# Patient Record
Sex: Male | Born: 2000 | Race: Black or African American | Hispanic: No | Marital: Single | Smoking: Current every day smoker
Health system: Southern US, Community
[De-identification: ages and names within clinical notes are randomized; demographics above are authoritative.]

## PROBLEM LIST (undated history)

## (undated) DIAGNOSIS — S42409A Unspecified fracture of lower end of unspecified humerus, initial encounter for closed fracture: Secondary | ICD-10-CM

## (undated) DIAGNOSIS — R569 Unspecified convulsions: Secondary | ICD-10-CM

---
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
## 2024-03-05 ENCOUNTER — Emergency Department (HOSPITAL_COMMUNITY)
Admission: EM | Admit: 2024-03-05 | Discharge: 2024-03-05 | Disposition: A | Discharge status: Home / Self Care | Attending: Emergency Medicine | Admitting: Emergency Medicine

## 2024-03-05 ENCOUNTER — Other Ambulatory Visit: Payer: Self-pay

## 2024-03-05 ENCOUNTER — Emergency Department (HOSPITAL_COMMUNITY)

## 2024-03-05 ENCOUNTER — Encounter (HOSPITAL_COMMUNITY): Payer: Self-pay

## 2024-03-05 DIAGNOSIS — K6289 Other specified diseases of anus and rectum: Secondary | ICD-10-CM | POA: Diagnosis not present

## 2024-03-05 DIAGNOSIS — R197 Diarrhea, unspecified: Secondary | ICD-10-CM

## 2024-03-05 DIAGNOSIS — R109 Unspecified abdominal pain: Secondary | ICD-10-CM

## 2024-03-05 LAB — RAPID HIV SCREEN (HIV 1/2 AB+AG)
HIV 1/2 Antibodies: NONREACTIVE
HIV-1 P24 Antigen - HIV24: NONREACTIVE

## 2024-03-05 LAB — COMPREHENSIVE METABOLIC PANEL WITH GFR
ALT: 21 U/L (ref 0–44)
AST: 24 U/L (ref 15–41)
Albumin: 3.8 g/dL (ref 3.5–5.0)
Alkaline Phosphatase: 66 U/L (ref 38–126)
Anion gap: 12 (ref 5–15)
BUN: 11 mg/dL (ref 6–20)
CO2: 24 mmol/L (ref 22–32)
Calcium: 9 mg/dL (ref 8.9–10.3)
Chloride: 96 mmol/L — ABNORMAL LOW (ref 98–111)
Creatinine, Ser: 1.16 mg/dL (ref 0.61–1.24)
GFR, Estimated: >60 mL/min (ref >60)
Glucose, Bld: 85 mg/dL (ref 70–99)
Potassium: 3.6 mmol/L (ref 3.5–5.1)
Sodium: 132 mmol/L — ABNORMAL LOW (ref 135–145)
Total Bilirubin: 1.4 mg/dL — ABNORMAL HIGH (ref 0.0–1.2)
Total Protein: 7.2 g/dL (ref 6.5–8.1)

## 2024-03-05 LAB — I-STAT CHEM 8, ED
BUN: 12 mg/dL (ref 6–20)
BUN: 13 mg/dL (ref 6–20)
Calcium, Ion: 1.18 mmol/L (ref 1.15–1.40)
Calcium, Ion: 1.15 mmol/L (ref 1.15–1.40)
Chloride: 97 mmol/L — ABNORMAL LOW (ref 98–111)
Chloride: 96 mmol/L — ABNORMAL LOW (ref 98–111)
Creatinine, Ser: 1.2 mg/dL (ref 0.61–1.24)
Creatinine, Ser: 1.2 mg/dL (ref 0.61–1.24)
Glucose, Bld: 88 mg/dL (ref 70–99)
Glucose, Bld: 82 mg/dL (ref 70–99)
HCT: 47 % (ref 39.0–52.0)
HCT: 45 % (ref 39.0–52.0)
Hemoglobin: 16 g/dL (ref 13.0–17.0)
Hemoglobin: 15.3 g/dL (ref 13.0–17.0)
Potassium: 3.6 mmol/L (ref 3.5–5.1)
Potassium: 3.6 mmol/L (ref 3.5–5.1)
Sodium: 134 mmol/L — ABNORMAL LOW (ref 135–145)
Sodium: 134 mmol/L — ABNORMAL LOW (ref 135–145)
TCO2: 24 mmol/L (ref 22–32)
TCO2: 25 mmol/L (ref 22–32)

## 2024-03-05 LAB — URINALYSIS, W/ REFLEX TO CULTURE (INFECTION SUSPECTED)
Bacteria, UA: NONE SEEN
Bilirubin Urine: NEGATIVE
Glucose, UA: NEGATIVE mg/dL
Hgb urine dipstick: NEGATIVE
Ketones, ur: NEGATIVE mg/dL
Nitrite: NEGATIVE
Protein, ur: 30 mg/dL — AB
Specific Gravity, Urine: 1.018 (ref 1.005–1.030)
pH: 6 (ref 5.0–8.0)

## 2024-03-05 LAB — CBC WITH DIFFERENTIAL/PLATELET
Abs Immature Granulocytes: 0.06 K/uL (ref 0.00–0.07)
Basophils Absolute: 0 K/uL (ref 0.0–0.1)
Basophils Relative: 0 %
Eosinophils Absolute: 0 K/uL (ref 0.0–0.5)
Eosinophils Relative: 0 %
HCT: 44.9 % (ref 39.0–52.0)
Hemoglobin: 15.2 g/dL (ref 13.0–17.0)
Immature Granulocytes: 0 %
Lymphocytes Relative: 7 %
Lymphs Abs: 1.1 K/uL (ref 0.7–4.0)
MCH: 32.1 pg (ref 26.0–34.0)
MCHC: 33.9 g/dL (ref 30.0–36.0)
MCV: 94.7 fL (ref 80.0–100.0)
Monocytes Absolute: 0.9 K/uL (ref 0.1–1.0)
Monocytes Relative: 6 %
Neutro Abs: 12.5 K/uL — ABNORMAL HIGH (ref 1.7–7.7)
Neutrophils Relative %: 87 %
Platelets: 247 K/uL (ref 150–400)
RBC: 4.74 MIL/uL (ref 4.22–5.81)
RDW: 12 % (ref 11.5–15.5)
WBC: 14.6 K/uL — ABNORMAL HIGH (ref 4.0–10.5)
nRBC: 0 % (ref 0.0–0.2)

## 2024-03-05 LAB — I-STAT CG4 LACTIC ACID, ED
Lactic Acid, Venous: 0.7 mmol/L (ref 0.5–1.9)
Lactic Acid, Venous: 0.9 mmol/L (ref 0.5–1.9)

## 2024-03-05 LAB — LIPASE, BLOOD: Lipase: 152 U/L — ABNORMAL HIGH (ref 11–51)

## 2024-03-05 MED ORDER — IOHEXOL 350 MG/ML SOLN
75.0000 mL | Freq: Once | INTRAVENOUS | Status: AC | PRN
  Administered 2024-03-05: 75 mL via INTRAVENOUS

## 2024-03-05 MED ORDER — DOXYCYCLINE HYCLATE 100 MG PO CAPS: 100.0000 mg | ORAL_CAPSULE | Freq: Two times a day (BID) | ORAL | 0 refills | Status: AC

## 2024-03-05 MED ORDER — KETOROLAC TROMETHAMINE 15 MG/ML IJ SOLN
15.0000 mg | Freq: Once | INTRAMUSCULAR | Status: AC
  Administered 2024-03-05: 15 mg via INTRAVENOUS
  Filled 2024-03-05: qty 1

## 2024-03-05 MED ORDER — IOHEXOL 350 MG/ML SOLN: 75.0000 mL | Freq: Once | INTRAVENOUS | Status: DC | PRN

## 2024-03-05 MED ORDER — CEFTRIAXONE SODIUM 1 G IJ SOLR
1.0000 g | Freq: Once | INTRAMUSCULAR | Status: AC
  Administered 2024-03-05: 1 g via INTRAVENOUS
  Filled 2024-03-05: qty 10

## 2024-03-05 MED ORDER — HYDROMORPHONE HCL 1 MG/ML IJ SOLN
0.5000 mg | Freq: Once | INTRAMUSCULAR | Status: AC
  Administered 2024-03-05: 0.5 mg via INTRAVENOUS
  Filled 2024-03-05: qty 1

## 2024-03-05 MED ORDER — ONDANSETRON HCL 4 MG/2ML IJ SOLN
4.0000 mg | Freq: Once | INTRAMUSCULAR | Status: AC
  Administered 2024-03-05: 4 mg via INTRAVENOUS
  Filled 2024-03-05: qty 2

## 2024-03-05 NOTE — ED Provider Notes (Signed)
 [place] EMERGENCY DEPARTMENT AT [facility] Provider Note   CSN: [id number] Arrival date & time: 03/05/24  1159     Patient presents with: Abdominal Pain   Anthony Williams is a 23 y.o. male.   23 year old male history of appendicitis who presents to the emergency department with abdominal pain and diarrhea.  Patient reports that last night he started feeling poorly and having subjective fevers.  Says he started having some lower abdominal pain.  Also had multiple episodes of loose stools.  Says that this morning he is having mucoid stools.  Has had this happen in the past.  Has appendix removed because of this.  Also had a colonoscopy that showed internal hemorrhoids without any other acute abnormalities.  Did have some biopsies without inflammation.  Previously engaged in receptive anal intercourse but says it has been several months since.  Not having any penile discharge or concern for STIs otherwise       Prior to Admission medications   Medication Sig Start Date End Date Taking? Authorizing Provider  doxycycline (VIBRAMYCIN) 100 MG capsule Take 1 capsule (100 mg total) by mouth 2 (two) times daily for 7 days. 03/05/24 03/12/24 Yes Yolande Lamar BROCKS, MD  ibuprofen  (ADVIL ) 200 MG tablet Take 200 mg by mouth every 6 (six) hours as needed.   Yes [provider]    Allergies: Patient has no known allergies.    Review of Systems  Updated Vital Signs BP 114/74 (BP Location: Right Arm)   Pulse 82   Temp 100.1 F (37.8 C) (Oral)   Resp 18   Ht 5' 5 (1.651 m)   Wt 54 kg   SpO2 100%   BMI 19.80 kg/m   Physical Exam Vitals and nursing note reviewed.  Constitutional:      General: He is not in acute distress.    Appearance: He is well-developed.  HENT:     Head: Normocephalic and atraumatic.     Right Ear: External ear normal.     Left Ear: External ear normal.     Nose: Nose normal.  Eyes:     Extraocular Movements: Extraocular movements intact.      Conjunctiva/sclera: Conjunctivae normal.     Pupils: Pupils are equal, round, and reactive to light.  Cardiovascular:     Heart sounds: Normal heart sounds.  Abdominal:     General: There is no distension.     Palpations: Abdomen is soft. There is no mass.     Tenderness: There is abdominal tenderness (Left lower quadrant). There is no guarding.  Musculoskeletal:     Cervical back: Normal range of motion and neck supple.  Neurological:     Mental Status: He is alert.     (all labs ordered are listed, but only abnormal results are displayed) Labs Reviewed  COMPREHENSIVE METABOLIC PANEL WITH GFR - Abnormal; Notable for the following components:      Result Value   Sodium 132 (*)    Chloride 96 (*)    Total Bilirubin 1.4 (*)    All other components within normal limits  CBC WITH DIFFERENTIAL/PLATELET - Abnormal; Notable for the following components:   WBC 14.6 (*)    Neutro Abs 12.5 (*)    All other components within normal limits  URINALYSIS, W/ REFLEX TO CULTURE (INFECTION SUSPECTED) - Abnormal; Notable for the following components:   Protein, ur 30 (*)    Leukocytes,Ua MODERATE (*)    All other components within normal limits  LIPASE, BLOOD - Abnormal; Notable for the following components:   Lipase 152 (*)    All other components within normal limits  I-STAT CHEM 8, ED - Abnormal; Notable for the following components:   Sodium 134 (*)    Chloride 97 (*)    All other components within normal limits  I-STAT CHEM 8, ED - Abnormal; Notable for the following components:   Sodium 134 (*)    Chloride 96 (*)    All other components within normal limits  URINE CULTURE  RAPID HIV SCREEN (HIV 1/2 AB+AG)  RPR  I-STAT CG4 LACTIC ACID, ED  I-STAT CG4 LACTIC ACID, ED    EKG: None  Radiology: CT ABDOMEN PELVIS W CONTRAST Result Date: 03/05/2024 EXAM: CT ABDOMEN AND PELVIS WITH CONTRAST 03/05/2024 02:19:41 PM TECHNIQUE: CT of the abdomen and pelvis was performed with the  administration of 75 mL of iohexol (OMNIPAQUE) 350 MG/ML injection. Multiplanar reformatted images are provided for review. Automated exposure control, iterative reconstruction, and/or weight-based adjustment of the mA/kV was utilized to reduce the radiation dose to as low as reasonably achievable. COMPARISON: None available. CLINICAL HISTORY: LLQ pain. Reports fever abd pain and mucus coming from rectum. Reports last time he had this it was his appendix. Patient reports severe lower abd pain. FINDINGS: LOWER CHEST: No acute abnormality. LIVER: The liver is unremarkable. GALLBLADDER AND BILE DUCTS: Gallbladder is unremarkable. No biliary ductal dilatation. SPLEEN: No acute abnormality. PANCREAS: No acute abnormality. ADRENAL GLANDS: No acute abnormality. KIDNEYS, URETERS AND BLADDER: No stones in the kidneys or ureters. No hydronephrosis. No perinephric or periureteral stranding. The urinary bladder is collapsed. GI AND BOWEL: Stomach demonstrates no acute abnormality. Evaluation of the bowel is limited in the absence of oral contrast. There is no bowel obstruction. Appendectomy. PERITONEUM AND RETROPERITONEUM: No ascites. No free air. VASCULATURE: Aorta is normal in caliber. LYMPH NODES: No lymphadenopathy. REPRODUCTIVE ORGANS: No acute abnormality. BONES AND SOFT TISSUES: No acute osseous abnormality. No focal soft tissue abnormality. IMPRESSION: 1. No acute abnormalities. Electronically signed by: Vanetta Chou MD 03/05/2024 02:35 PM EDT RP Workstation: [id number]     Procedures   Medications Ordered in the ED  iohexol (OMNIPAQUE) 350 MG/ML injection 75 mL ( Intravenous Canceled Entry 03/05/24 1420)  HYDROmorphone (DILAUDID) injection 0.5 mg (0.5 mg Intravenous Given 03/05/24 1250)  ondansetron (ZOFRAN) injection 4 mg (4 mg Intravenous Given 03/05/24 1250)  ketorolac (TORADOL) 15 MG/ML injection 15 mg (15 mg Intravenous Given 03/05/24 1250)  iohexol (OMNIPAQUE) 350 MG/ML injection 75 mL (75 mLs  Intravenous Contrast Given 03/05/24 1410)  cefTRIAXone (ROCEPHIN) 1 g in sodium chloride 0.9 % 100 mL IVPB (0 g Intravenous Stopped 03/05/24 1602)                                    Medical Decision Making Amount and/or Complexity of Data Reviewed Labs: ordered. Radiology: ordered.  Risk Prescription drug management.   23 year old male history of appendicitis and diarrhea in the past who presents emergency department with abdominal pain and diarrhea  Initial Ddx:  Inflammatory bowel disease, colitis, proctitis, C. difficile, infectious diarrhea, diverticulitis  MDM/Course:  Patient presents emergency department with abdominal pain and diarrhea.  On exam is not in acute distress.  Does have some mild left lower quadrant abdominal tenderness palpation.  Has had colonoscopy in the past for similar symptoms and has already had an appendectomy.  Does not appear to have had  any imaging.  Due to concerns for possible diverticulitis and colitis did obtain CT scan today that did not show any acute findings.  Suspect that patient may have proctitis.  With his history of receptive anal intercourse we will go ahead and treat him in case this is related to STIs.  Given a dose of ceftriaxone here in the emergency department and sent home with doxycycline.  Did attempt to get a stool sample but patient was unable to provide it at this point in time.  Will have him follow-up with GI as well to see if he needs additional evaluation for his recurrent diarrhea  This patient presents to the ED for concern of complaints listed in HPI, this involves an extensive number of treatment options, and is a complaint that carries with it a high risk of complications and morbidity. Disposition including potential need for admission considered.   Dispo: DC Home. Return precautions discussed including, but not limited to, those listed in the AVS. Allowed pt time to ask questions which were answered fully prior to  dc.  Additional history obtained from mother Records reviewed Outpatient Clinic Notes The following labs were independently interpreted: Chemistry and show no acute abnormality I independently reviewed the following imaging with scope of interpretation limited to determining acute life threatening conditions related to emergency care: CT Abdomen/Pelvis and agree with the radiologist interpretation with the following exceptions: none I personally reviewed and interpreted cardiac monitoring: normal sinus rhythm  I personally reviewed and interpreted the pt's EKG: see above for interpretation  I have reviewed the patients home medications and made adjustments as needed  Portions of this note were generated with Dragon dictation software. Dictation errors may occur despite best attempts at proofreading.     Final diagnoses:  Proctitis  Diarrhea, unspecified type  Abdominal pain, unspecified abdominal location    ED Discharge Orders          Ordered    doxycycline (VIBRAMYCIN) 100 MG capsule  2 times daily        03/05/24 1454    Ambulatory referral to Gastroenterology        03/05/24 1454               Yolande Lamar BROCKS, MD 03/05/24 [phone]

## 2024-03-05 NOTE — ED Notes (Signed)
 Inefficient amount of stool provided, unable to send sample at this time.

## 2024-03-05 NOTE — ED Triage Notes (Signed)
 Reports fever abd pain and mucus coming from rectum.  Reports last time he had this it was his appendix.  Patient reports severe lower abd pain.

## 2024-03-05 NOTE — Discharge Instructions (Addendum)
 You were seen for proctitis in the emergency department.   At home, please take the antibiotics we have given you.    Check your MyChart online for the results of any tests that had not resulted by the time you left the emergency department.   Follow-up with your primary doctor in 2-3 days regarding your visit.  Follow-up with GI about your symptoms.   Return immediately to the emergency department if you experience any of the following: worsening pain, or any other concerning symptoms.    Thank you for visiting our Emergency Department. It was a pleasure taking care of you today.

## 2024-03-06 LAB — URINE CULTURE: Culture: NO GROWTH

## 2024-03-06 LAB — RPR: RPR Ser Ql: NONREACTIVE

## 2024-06-09 ENCOUNTER — Ambulatory Visit: Admitting: Gastroenterology

## 2024-06-09 NOTE — Progress Notes (Unsigned)
 "  Chief Complaint:follow-up after ED visit abd pain, diarrhea Primary GI Doctor:***  HPI:  Patient is a  24  year old male patient with past medical history of idiopathic epilepsy,*****who was referred to me by Yolande Lamar BROCKS, MD on 03/05/24 for a evaluation of abdominal pain, diarrhea .    03/05/24 ED visit for diarrhea and abdominal pain. CT scan unremarkable. Labs show: Sodium 132, total bili 1.4, WBC 14.6, Hgb 15.2, lipase 152, HIV neg. Suspect that patient Ceilidh Torregrossa have proctitis. With his history of receptive anal intercourse we will go ahead and treat him in case this is related to STIs. Given a dose of ceftriaxone  here in the emergency department and sent home with doxycycline .   Interval History  Patient admits/denies GERD Patient admits/denies dysphagia Patient admits/denies nausea, vomiting, or weight loss  Patient admits/denies altered bowel habits Patient admits/denies abdominal pain Patient admits/denies rectal bleeding   Denies/Admits alcohol Denies/Admits smoking Denies/Admits NSAID use. Denies/Admits they are on blood thinners.  Patients last colonoscopy Patients last EGD  Surgical history:  Patient's family history includes  Wt Readings from Last 3 Encounters:  03/05/24 119 lb (54 kg)  07/01/20 119 lb (54 kg) (6%, Z= -1.54)*  01/01/13 72 lb (32.7 kg) (40%, Z= -0.25)*   * Growth percentiles are based on CDC (Boys, 2-20 Years) data.      Past Medical History:  Diagnosis Date   Elbow fracture    right   Seizures (HCC)     No past surgical history on file.  Current Outpatient Medications  Medication Sig Dispense Refill   ibuprofen  (ADVIL ) 200 MG tablet Take 200 mg by mouth every 6 (six) hours as needed.     No current facility-administered medications for this visit.    Allergies as of 06/09/2024   (No Known Allergies)    Family History  Problem Relation Age of Onset   Healthy Mother    Healthy Father     Review of Systems:     Constitutional: No weight loss, fever, chills, weakness or fatigue HEENT: Eyes: No change in vision               Ears, Nose, Throat:  No change in hearing or congestion Skin: No rash or itching Cardiovascular: No chest pain, chest pressure or palpitations   Respiratory: No SOB or cough Gastrointestinal: See HPI and otherwise negative Genitourinary: No dysuria or change in urinary frequency Neurological: No headache, dizziness or syncope Musculoskeletal: No new muscle or joint pain Hematologic: No bleeding or bruising Psychiatric: No history of depression or anxiety    Physical Exam:  Vital signs: There were no vitals taken for this visit.  Constitutional:   Pleasant *** male/male appears to be in NAD, Well developed, Well nourished, alert and cooperative Eyes:   PEERL, EOMI. No icterus. Conjunctiva pink. Neck:  Supple Throat: Oral cavity and pharynx without inflammation, swelling or lesion.  Respiratory: Respirations even and unlabored. Lungs clear to auscultation bilaterally.   No wheezes, crackles, or rhonchi.  Cardiovascular: Normal S1, S2. Regular rate and rhythm. No peripheral edema, cyanosis or pallor.  Gastrointestinal:  Soft, nondistended, nontender. No rebound or guarding. Normal bowel sounds. No appreciable masses or hepatomegaly. Rectal:  Not performed.  Anoscopy: Msk:  Symmetrical without gross deformities. Without edema, no deformity or joint abnormality.  Neurologic:  Alert and  oriented x4;  grossly normal neurologically.  Skin:   Dry and intact without significant lesions or rashes.  RELEVANT LABS AND IMAGING: CBC  Latest Ref Rng & Units 03/05/2024    1:09 PM 03/05/2024   12:25 PM 03/05/2024   12:19 PM  CBC  WBC 4.0 - 10.5 K/uL   14.6   Hemoglobin 13.0 - 17.0 g/dL 84.6  83.9  84.7   Hematocrit 39.0 - 52.0 % 45.0  47.0  44.9   Platelets 150 - 400 K/uL   247      CMP     Latest Ref Rng & Units 03/05/2024    1:09 PM 03/05/2024   12:25 PM 03/05/2024    12:19 PM  CMP  Glucose 70 - 99 mg/dL 82  88  85   BUN 6 - 20 mg/dL 13  12  11    Creatinine 0.61 - 1.24 mg/dL 8.79  8.79  8.83   Sodium 135 - 145 mmol/L 134  134  132   Potassium 3.5 - 5.1 mmol/L 3.6  3.6  3.6   Chloride 98 - 111 mmol/L 96  97  96   CO2 22 - 32 mmol/L   24   Calcium 8.9 - 10.3 mg/dL   9.0   Total Protein 6.5 - 8.1 g/dL   7.2   Total Bilirubin 0.0 - 1.2 mg/dL   1.4   Alkaline Phos 38 - 126 U/L   66   AST 15 - 41 U/L   24   ALT 0 - 44 U/L   21    03/05/24 CTAP IMPRESSION: 1. No acute abnormalities.  09/2021 colonoscopy in [place], [place] Normal colonoscopy Final Diagnosis A.  TI, endoscopic biopsy:    - Benign small bowel mucosa    - Normal villous architecture    - Active inflammation and granulomas not identified B.  Random colon, endoscopic biopsy:    - Benign colonic mucosa    - No evidence of microscopic colitis    - No evidence of dysplasia      Assessment: 1. ***  Plan: -CBC, BMET, hepatic panel, CRP, TTG IgA, IgA, TSH  -stool tests   Thank you for the courtesy of this consult. Please call me with any questions or concerns.   Alizabeth Antonio, FNP-C [facility] 06/09/2024, 6:26 AM  Cc: Yolande Lamar BROCKS, MD  "

## 2024-07-05 ENCOUNTER — Inpatient Hospital Stay (HOSPITAL_COMMUNITY)
Admission: AD | Admit: 2024-07-05 | Discharge: 2024-07-06 | Disposition: A | Source: Intra-hospital | Attending: Student in an Organized Health Care Education/Training Program

## 2024-07-05 ENCOUNTER — Other Ambulatory Visit: Payer: Self-pay

## 2024-07-05 ENCOUNTER — Ambulatory Visit (HOSPITAL_COMMUNITY)
Admission: EM | Admit: 2024-07-05 | Discharge: 2024-07-05 | Disposition: A | Discharge status: Other Acute Inpatient Hospital | Attending: Student in an Organized Health Care Education/Training Program | Admitting: Student in an Organized Health Care Education/Training Program

## 2024-07-05 ENCOUNTER — Encounter (HOSPITAL_COMMUNITY): Payer: Self-pay

## 2024-07-05 DIAGNOSIS — Z9151 Personal history of suicidal behavior: Secondary | ICD-10-CM | POA: Insufficient documentation

## 2024-07-05 DIAGNOSIS — Z5689 Other problems related to employment: Secondary | ICD-10-CM | POA: Insufficient documentation

## 2024-07-05 DIAGNOSIS — Z5900 Homelessness unspecified: Secondary | ICD-10-CM | POA: Insufficient documentation

## 2024-07-05 DIAGNOSIS — F1290 Cannabis use, unspecified, uncomplicated: Secondary | ICD-10-CM | POA: Insufficient documentation

## 2024-07-05 DIAGNOSIS — F332 Major depressive disorder, recurrent severe without psychotic features: Principal | ICD-10-CM | POA: Diagnosis present

## 2024-07-05 DIAGNOSIS — F411 Generalized anxiety disorder: Secondary | ICD-10-CM | POA: Insufficient documentation

## 2024-07-05 DIAGNOSIS — F419 Anxiety disorder, unspecified: Secondary | ICD-10-CM | POA: Insufficient documentation

## 2024-07-05 DIAGNOSIS — Z5982 Transportation insecurity: Secondary | ICD-10-CM | POA: Insufficient documentation

## 2024-07-05 DIAGNOSIS — F4323 Adjustment disorder with mixed anxiety and depressed mood: Secondary | ICD-10-CM | POA: Insufficient documentation

## 2024-07-05 DIAGNOSIS — G4700 Insomnia, unspecified: Secondary | ICD-10-CM | POA: Insufficient documentation

## 2024-07-05 LAB — ETHANOL: Alcohol, Ethyl (B): <15 mg/dL

## 2024-07-05 LAB — CBC WITH DIFFERENTIAL/PLATELET
Abs Immature Granulocytes: 0.02 10*3/uL (ref 0.00–0.07)
Basophils Absolute: 0 10*3/uL (ref 0.0–0.1)
Basophils Relative: 1 %
Eosinophils Absolute: 0.2 10*3/uL (ref 0.0–0.5)
Eosinophils Relative: 3 %
HCT: 45.3 % (ref 39.0–52.0)
Hemoglobin: 15.2 g/dL (ref 13.0–17.0)
Immature Granulocytes: 0 %
Lymphocytes Relative: 30 %
Lymphs Abs: 1.9 10*3/uL (ref 0.7–4.0)
MCH: 31.7 pg (ref 26.0–34.0)
MCHC: 33.6 g/dL (ref 30.0–36.0)
MCV: 94.4 fL (ref 80.0–100.0)
Monocytes Absolute: 0.6 10*3/uL (ref 0.1–1.0)
Monocytes Relative: 9 %
Neutro Abs: 3.8 10*3/uL (ref 1.7–7.7)
Neutrophils Relative %: 57 %
Platelets: 269 10*3/uL (ref 150–400)
RBC: 4.8 MIL/uL (ref 4.22–5.81)
RDW: 12.1 % (ref 11.5–15.5)
WBC: 6.5 10*3/uL (ref 4.0–10.5)
nRBC: 0 % (ref 0.0–0.2)

## 2024-07-05 LAB — TSH: TSH: 0.712 u[IU]/mL (ref 0.350–4.500)

## 2024-07-05 LAB — POCT URINE DRUG SCREEN - MANUAL ENTRY (I-SCREEN)
POC Amphetamine UR: NOT DETECTED
POC Buprenorphine (BUP): NOT DETECTED
POC Cocaine UR: NOT DETECTED
POC Marijuana UR: POSITIVE — AB
POC Methadone UR: NOT DETECTED
POC Methamphetamine UR: NOT DETECTED
POC Morphine: NOT DETECTED
POC Oxazepam (BZO): NOT DETECTED
POC Oxycodone UR: NOT DETECTED
POC Secobarbital (BAR): NOT DETECTED

## 2024-07-05 LAB — COMPREHENSIVE METABOLIC PANEL WITH GFR
ALT: 45 U/L — ABNORMAL HIGH (ref 0–44)
AST: 36 U/L (ref 15–41)
Albumin: 4.7 g/dL (ref 3.5–5.0)
Alkaline Phosphatase: 72 U/L (ref 38–126)
Anion gap: 10 (ref 5–15)
BUN: 15 mg/dL (ref 6–20)
CO2: 28 mmol/L (ref 22–32)
Calcium: 9.6 mg/dL (ref 8.9–10.3)
Chloride: 98 mmol/L (ref 98–111)
Creatinine, Ser: 1.02 mg/dL (ref 0.61–1.24)
GFR, Estimated: >60 mL/min
Glucose, Bld: 66 mg/dL — ABNORMAL LOW (ref 70–99)
Potassium: 4.6 mmol/L (ref 3.5–5.1)
Sodium: 135 mmol/L (ref 135–145)
Total Bilirubin: 0.8 mg/dL (ref 0.0–1.2)
Total Protein: 7.5 g/dL (ref 6.5–8.1)

## 2024-07-05 LAB — LIPID PANEL
Cholesterol: 164 mg/dL (ref 0–200)
HDL: 60 mg/dL
LDL Cholesterol: 95 mg/dL (ref 0–99)
Total CHOL/HDL Ratio: 2.7 ratio
Triglycerides: 46 mg/dL
VLDL: 9 mg/dL (ref 0–40)

## 2024-07-05 LAB — HEMOGLOBIN A1C
Hgb A1c MFr Bld: 5.4 % (ref 4.8–5.6)
Mean Plasma Glucose: 108.28 mg/dL

## 2024-07-05 LAB — VITAMIN D 25 HYDROXY (VIT D DEFICIENCY, FRACTURES): Vit D, 25-Hydroxy: 10.8 ng/mL — ABNORMAL LOW (ref 30–100)

## 2024-07-05 MED ORDER — LORAZEPAM 2 MG/ML IJ SOLN: 2.0000 mg | Freq: Three times a day (TID) | INTRAMUSCULAR | Status: DC | PRN

## 2024-07-05 MED ORDER — NICOTINE 14 MG/24HR TD PT24: 14.0000 mg | MEDICATED_PATCH | Freq: Every day | TRANSDERMAL | Status: DC

## 2024-07-05 MED ORDER — DIPHENHYDRAMINE HCL 50 MG/ML IJ SOLN: 50.0000 mg | Freq: Three times a day (TID) | INTRAMUSCULAR | Status: DC | PRN

## 2024-07-05 MED ORDER — DIPHENHYDRAMINE HCL 25 MG PO CAPS: 50.0000 mg | ORAL_CAPSULE | Freq: Three times a day (TID) | ORAL | Status: DC | PRN

## 2024-07-05 MED ORDER — DIPHENHYDRAMINE HCL 50 MG PO CAPS: 50.0000 mg | ORAL_CAPSULE | Freq: Three times a day (TID) | ORAL | Status: DC | PRN

## 2024-07-05 MED ORDER — HYDROXYZINE HCL 25 MG PO TABS
25.0000 mg | ORAL_TABLET | Freq: Three times a day (TID) | ORAL | Status: DC | PRN
  Administered 2024-07-05: 25 mg via ORAL
  Filled 2024-07-05: qty 1

## 2024-07-05 MED ORDER — NICOTINE 14 MG/24HR TD PT24
14.0000 mg | MEDICATED_PATCH | Freq: Every day | TRANSDERMAL | Status: DC
  Administered 2024-07-05: 14 mg via TRANSDERMAL
  Filled 2024-07-05: qty 1

## 2024-07-05 MED ORDER — HYDROXYZINE HCL 25 MG PO TABS
25.0000 mg | ORAL_TABLET | Freq: Three times a day (TID) | ORAL | Status: DC | PRN
  Administered 2024-07-05 – 2024-07-06 (×2): 25 mg via ORAL
  Filled 2024-07-05 (×2): qty 1

## 2024-07-05 MED ORDER — HALOPERIDOL LACTATE 5 MG/ML IJ SOLN: 10.0000 mg | Freq: Three times a day (TID) | INTRAMUSCULAR | Status: DC | PRN

## 2024-07-05 MED ORDER — ACETAMINOPHEN 325 MG PO TABS: 650.0000 mg | ORAL_TABLET | Freq: Four times a day (QID) | ORAL | Status: DC | PRN

## 2024-07-05 MED ORDER — ALUM & MAG HYDROXIDE-SIMETH 200-200-20 MG/5ML PO SUSP: 30.0000 mL | ORAL | Status: DC | PRN

## 2024-07-05 MED ORDER — HALOPERIDOL LACTATE 5 MG/ML IJ SOLN: 5.0000 mg | Freq: Three times a day (TID) | INTRAMUSCULAR | Status: DC | PRN

## 2024-07-05 MED ORDER — TRAZODONE HCL 50 MG PO TABS: 50.0000 mg | ORAL_TABLET | Freq: Every evening | ORAL | Status: DC | PRN

## 2024-07-05 MED ORDER — TRAZODONE HCL 50 MG PO TABS
50.0000 mg | ORAL_TABLET | Freq: Every evening | ORAL | Status: DC | PRN
  Administered 2024-07-05: 50 mg via ORAL
  Filled 2024-07-05: qty 1

## 2024-07-05 MED ORDER — HALOPERIDOL 5 MG PO TABS: 5.0000 mg | ORAL_TABLET | Freq: Three times a day (TID) | ORAL | Status: DC | PRN

## 2024-07-05 MED ORDER — MAGNESIUM HYDROXIDE 400 MG/5ML PO SUSP: 30.0000 mL | Freq: Every day | ORAL | Status: DC | PRN
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
## 2024-07-05 NOTE — ED Provider Notes (Signed)
 [facility] Continuous Assessment Admission H&P  Date: 07/05/24 Patient Name: Anthony Williams MRN: [id number] Chief Complaint:  I have been having an hard time these couple of months  Diagnoses:  Final diagnoses:  Severe episode of recurrent major depressive disorder, without psychotic features (HCC)  Marijuana use   History of Present Illness (HPI): Anthony Williams is a 24 year old male with a history of major depressive disorder who presented voluntarily as a walk-in to [facility] on 07/05/2024. He presented with complaints of worsening depressive symptoms and passive suicidal ideation. The patient was seen face-to-face by this provider, and the chart was reviewed.  The patient reports that over the past several months he has experienced significant psychosocial stressors contributing to a progressive decline in mood. He states that he works as a primary school teacher at [employer] and reports that since October 2025 his work hours have been significantly reduced, resulting in financial instability. He reports being unable to pay his bills, which led to the loss of his housing in December 2025. Since that time, he has been intermittently staying with friends and reports ongoing housing insecurity. He endorses difficulty securing new employment, which has further contributed to feelings of hopelessness and helplessness. The patient reports worsening depressive symptoms, including difficulty getting out of bed, persistent low mood, anhedonia, decreased energy, poor concentration, decreased appetite, and insomnia, reporting approximately three hours of sleep per night. He endorses feelings of hopelessness, stating, I just dont see a way out, and I dont have anything left to give. He reports passive suicidal ideation, stating, I know that if I wake up tomorrow it will be the same thing, and I just cant do it, but denies active suicidal intent or  plan at this time. The patient reports a history of non-suicidal self-injurious behavior and a prior suicide attempt approximately five years ago during the COVID-19 pandemic, when he attempted overdose by ingesting multiple pills available in his home. He denies being hospitalized after this incident but states that he was disappointed when he woke. He reports daily nicotine  vape use and daily marijuana use. He endorses occasional alcohol use, reporting approximately 2-3 shots on weekends. He is not currently engaged in outpatient therapy or medication management and reports his last engagement in therapy was around age 76.  The patient denies access to firearms or other weapons in the home where he is currently staying.   During evaluation, Anthony Williams was seated and appeared in no acute distress. He was alert and oriented 4. Behavior was cooperative but withdrawn, with tearfulness noted throughout the assessment. Mood was depressed with blunted affect. Speech was clear but of decreased volume and with normal rate. Eye contact was intermittent. Thought process was coherent, logical, and goal directed. There was no objective evidence of the patient responding to internal or external stimuli or experiencing delusional thought content. The patient endorsed passive suicidal ideation without active intent or plan and denied current homicidal ideation, auditory hallucinations, visual hallucinations, and paranoia. Insight and judgment were fair. The patient was able to answer questions appropriately despite emotional distress.  Medical Decision Making: Given the combination of passive suicidal ideation, prior suicide attempt, poor sleep, limited coping capacity, psychosocial instability, and lack of outpatient supports, the patient is assessed to be at moderate to high risk for self-harm. He is unable to ensure his own safety at this time. Inpatient psychiatric admission is medically necessary for safety  monitoring, initiation and stabilization of pharmacologic  treatment, structured therapeutic intervention, and comprehensive discharge planning to address housing and psychosocial needs.    Total Time spent with patient: 45 minutes  Musculoskeletal  Strength & Muscle Tone: within normal limits Gait & Station: normal Patient leans: N/A  Psychiatric Specialty Exam  Presentation General Appearance:  Casual  Eye Contact: Minimal  Speech: Normal Rate  Speech Volume: Decreased  Handedness: Right   Mood and Affect  Mood: Depressed; Hopeless  Affect: Blunt; Tearful   Thought Process  Thought Processes: Linear; Coherent  Descriptions of Associations:Intact  Orientation:None  Thought Content:No data recorded   Hallucinations:No data recorded Ideas of Reference:No data recorded Suicidal Thoughts:Suicidal Thoughts: Yes, Passive SI Passive Intent and/or Plan: Without Intent; Without Plan  Homicidal Thoughts:Homicidal Thoughts: No   Sensorium  Memory: Immediate Fair; Recent Fair; Remote Fair  Judgment: Fair  Insight: Present   Executive Functions  Concentration: Fair  Attention Span: Fair  Recall: Fair  Fund of Knowledge: Good  Language: Good   Psychomotor Activity  Psychomotor Activity: Psychomotor Activity: Psychomotor Retardation   Assets  Assets: Communication Skills; Desire for Improvement; Resilience; Physical Health   Sleep  Sleep: Sleep: Poor Number of Hours of Sleep: 3   Nutritional Assessment (For OBS and FBC admissions only) Has the patient had a weight loss or gain of 10 pounds or more in the last 3 months?: No Has the patient had a decrease in food intake/or appetite?: Yes Does the patient have dental problems?: No Does the patient have eating habits or behaviors that may be indicators of an eating disorder including binging or inducing vomiting?: No Has the patient recently lost weight without trying?: 0 Has the  patient been eating poorly because of a decreased appetite?: 1 Malnutrition Screening Tool Score: 1    Physical Exam Vitals and nursing note reviewed.  Cardiovascular:     Rate and Rhythm: Normal rate.  Pulmonary:     Effort: Pulmonary effort is normal.  Neurological:     Mental Status: He is alert.  Psychiatric:        Attention and Perception: He does not perceive auditory or visual hallucinations.        Mood and Affect: Mood is depressed. Affect is blunt.        Speech: Speech normal.        Behavior: Behavior is cooperative.        Thought Content: Thought content does not include homicidal ideation. Thought content does not include homicidal plan.    Review of Systems  Psychiatric/Behavioral:  Positive for depression and substance abuse. Negative for hallucinations. The patient has insomnia.     Blood pressure 116/76, pulse 65, temperature 98.4 F (36.9 C), resp. rate 18, SpO2 100%. There is no height or weight on file to calculate BMI.  Past Psychiatric History: MDD  Is the patient at risk to self? yes Has the patient been a risk to self in the past 6 months? No .    Has the patient been a risk to self within the distant past? Yes   Is the patient a risk to others? No   Has the patient been a risk to others in the past 6 months? No   Has the patient been a risk to others within the distant past? No   Past Medical History: None reported  Family History: Mother with MDD & GAD  Social History: Patient is a 24 year old male with a high school diploma. He is currently employed as a primary school teacher at  [employer] with reduced work hours, resulting in financial strain. He is experiencing housing insecurity and is intermittently staying with friends. Patient reports a support system consisting of his mother, boyfriend, and best friend. Substance use history includes daily nicotine  vaping, daily marijuana use, and occasional alcohol use (2-3 shots on weekends).  Patient is not currently engaged in outpatient mental health treatment.  Last Labs:  Admission on 07/05/2024  Component Date Value Ref Range Status   WBC 07/05/2024 6.5  4.0 - 10.5 K/uL Final   RBC 07/05/2024 4.80  4.22 - 5.81 MIL/uL Final   Hemoglobin 07/05/2024 15.2  13.0 - 17.0 g/dL Final   HCT 97/95/7973 45.3  39.0 - 52.0 % Final   MCV 07/05/2024 94.4  80.0 - 100.0 fL Final   MCH 07/05/2024 31.7  26.0 - 34.0 pg Final   MCHC 07/05/2024 33.6  30.0 - 36.0 g/dL Final   RDW 97/95/7973 12.1  11.5 - 15.5 % Final   Platelets 07/05/2024 269  150 - 400 K/uL Final   nRBC 07/05/2024 0.0  0.0 - 0.2 % Final   Neutrophils Relative % 07/05/2024 57  % Final   Neutro Abs 07/05/2024 3.8  1.7 - 7.7 K/uL Final   Lymphocytes Relative 07/05/2024 30  % Final   Lymphs Abs 07/05/2024 1.9  0.7 - 4.0 K/uL Final   Monocytes Relative 07/05/2024 9  % Final   Monocytes Absolute 07/05/2024 0.6  0.1 - 1.0 K/uL Final   Eosinophils Relative 07/05/2024 3  % Final   Eosinophils Absolute 07/05/2024 0.2  0.0 - 0.5 K/uL Final   Basophils Relative 07/05/2024 1  % Final   Basophils Absolute 07/05/2024 0.0  0.0 - 0.1 K/uL Final   Immature Granulocytes 07/05/2024 0  % Final   Abs Immature Granulocytes 07/05/2024 0.02  0.00 - 0.07 K/uL Final   Performed at [facility], [address], [place], [place] [address]   Hgb A1c MFr Bld 07/05/2024 5.4  4.8 - 5.6 % Final   Comment: (NOTE) Diagnosis of Diabetes The following HbA1c ranges recommended by the American Diabetes Association (ADA) may be used as an aid in the diagnosis of diabetes mellitus.  Hemoglobin             Suggested A1C NGSP%              Diagnosis  <5.7                   Non Diabetic  5.7-6.4                Pre-Diabetic  >6.4                   Diabetic  <7.0                   Glycemic control for                       adults with diabetes.     Mean Plasma Glucose 07/05/2024 108.28  mg/dL Final   Performed at [facility], [address], [place], [place] [address]   Alcohol, Ethyl (B) 07/05/2024 <15  <15 mg/dL Final   Comment: (NOTE) For medical purposes only. Performed at [facility], [address], [place], [place] [address]    Cholesterol 07/05/2024 164  0 - 200 mg/dL Final   Comment:        ATP III CLASSIFICATION:  <  200     mg/dL   Desirable  799-760  mg/dL   Borderline High  >=759    mg/dL   High           Triglycerides 07/05/2024 46  <150 mg/dL Final   HDL 97/95/7973 60  >40 mg/dL Final   Total CHOL/HDL Ratio 07/05/2024 2.7  RATIO Final   VLDL 07/05/2024 9  0 - 40 mg/dL Final   LDL Cholesterol 07/05/2024 95  0 - 99 mg/dL Final   Comment:        Total Cholesterol/HDL:CHD Risk Coronary Heart Disease Risk Table                     Men   Women  1/2 Average Risk   3.4   3.3  Average Risk       5.0   4.4  2 X Average Risk   9.6   7.1  3 X Average Risk  23.4   11.0        Use the calculated Patient Ratio above and the CHD Risk Table to determine the patient's CHD Risk.        ATP III CLASSIFICATION (LDL):  <100     mg/dL   Optimal  899-870  mg/dL   Near or Above                    Optimal  130-159  mg/dL   Borderline  839-810  mg/dL   High  >809     mg/dL   Very High Performed at [facility], [address] [place], [place] [address]    POC Amphetamine UR 07/05/2024 None Detected  NONE DETECTED (Cut Off Level 1000 ng/mL) Final   POC Secobarbital (BAR) 07/05/2024 None Detected  NONE DETECTED (Cut Off Level 300 ng/mL) Final   POC Buprenorphine (BUP) 07/05/2024 None Detected  NONE DETECTED (Cut Off Level 10 ng/mL) Final   POC Oxazepam (BZO) 07/05/2024 None Detected  NONE DETECTED (Cut Off Level 300 ng/mL) Final   POC Cocaine UR 07/05/2024 None Detected  NONE DETECTED (Cut Off Level 300 ng/mL) Final   POC Methamphetamine UR 07/05/2024 None Detected  NONE DETECTED (Cut Off Level 1000 ng/mL) Final   POC Morphine 07/05/2024 None Detected  NONE DETECTED (Cut Off Level 300 ng/mL) Final   POC  Methadone UR 07/05/2024 None Detected  NONE DETECTED (Cut Off Level 300 ng/mL) Final   POC Oxycodone UR 07/05/2024 None Detected  NONE DETECTED (Cut Off Level 100 ng/mL) Final   POC Marijuana UR 07/05/2024 Positive (A)  NONE DETECTED (Cut Off Level 50 ng/mL) Final  Admission on 03/05/2024, Discharged on 03/05/2024  Component Date Value Ref Range Status   Lactic Acid, Venous 03/05/2024 0.9  0.5 - 1.9 mmol/L Final   Sodium 03/05/2024 132 (L)  135 - 145 mmol/L Final   Potassium 03/05/2024 3.6  3.5 - 5.1 mmol/L Final   Chloride 03/05/2024 96 (L)  98 - 111 mmol/L Final   CO2 03/05/2024 24  22 - 32 mmol/L Final   Glucose, Bld 03/05/2024 85  70 - 99 mg/dL Final   Glucose reference range applies only to samples taken after fasting for at least 8 hours.   BUN 03/05/2024 11  6 - 20 mg/dL Final   Creatinine, Ser 03/05/2024 1.16  0.61 - 1.24 mg/dL Final   Calcium 89/94/7974 9.0  8.9 - 10.3 mg/dL Final   Total Protein 89/94/7974 7.2  6.5 - 8.1  g/dL Final   Albumin 89/94/7974 3.8  3.5 - 5.0 g/dL Final   AST 89/94/7974 24  15 - 41 U/L Final   ALT 03/05/2024 21  0 - 44 U/L Final   Alkaline Phosphatase 03/05/2024 66  38 - 126 U/L Final   Total Bilirubin 03/05/2024 1.4 (H)  0.0 - 1.2 mg/dL Final   GFR, Estimated 03/05/2024 >60  >60 mL/min Final   Comment: (NOTE) Calculated using the CKD-EPI Creatinine Equation (2021)    Anion gap 03/05/2024 12  5 - 15 Final   Performed at [facility], [address], [place], [place] [address]   WBC 03/05/2024 14.6 (H)  4.0 - 10.5 K/uL Final   RBC 03/05/2024 4.74  4.22 - 5.81 MIL/uL Final   Hemoglobin 03/05/2024 15.2  13.0 - 17.0 g/dL Final   HCT 89/94/7974 44.9  39.0 - 52.0 % Final   MCV 03/05/2024 94.7  80.0 - 100.0 fL Final   MCH 03/05/2024 32.1  26.0 - 34.0 pg Final   MCHC 03/05/2024 33.9  30.0 - 36.0 g/dL Final   RDW 89/94/7974 12.0  11.5 - 15.5 % Final   Platelets 03/05/2024 247  150 - 400 K/uL Final   nRBC 03/05/2024 0.0  0.0 - 0.2 % Final    Neutrophils Relative % 03/05/2024 87  % Final   Neutro Abs 03/05/2024 12.5 (H)  1.7 - 7.7 K/uL Final   Lymphocytes Relative 03/05/2024 7  % Final   Lymphs Abs 03/05/2024 1.1  0.7 - 4.0 K/uL Final   Monocytes Relative 03/05/2024 6  % Final   Monocytes Absolute 03/05/2024 0.9  0.1 - 1.0 K/uL Final   Eosinophils Relative 03/05/2024 0  % Final   Eosinophils Absolute 03/05/2024 0.0  0.0 - 0.5 K/uL Final   Basophils Relative 03/05/2024 0  % Final   Basophils Absolute 03/05/2024 0.0  0.0 - 0.1 K/uL Final   Immature Granulocytes 03/05/2024 0  % Final   Abs Immature Granulocytes 03/05/2024 0.06  0.00 - 0.07 K/uL Final   Performed at [facility], [address], [place], [place] [address]   Specimen Source 03/05/2024 URINE, CLEAN CATCH   Final   Color, Urine 03/05/2024 YELLOW  YELLOW Final   APPearance 03/05/2024 CLEAR  CLEAR Final   Specific Gravity, Urine 03/05/2024 1.018  1.005 - 1.030 Final   pH 03/05/2024 6.0  5.0 - 8.0 Final   Glucose, UA 03/05/2024 NEGATIVE  NEGATIVE mg/dL Final   Hgb urine dipstick 03/05/2024 NEGATIVE  NEGATIVE Final   Bilirubin Urine 03/05/2024 NEGATIVE  NEGATIVE Final   Ketones, ur 03/05/2024 NEGATIVE  NEGATIVE mg/dL Final   Protein, ur 89/94/7974 30 (A)  NEGATIVE mg/dL Final   Nitrite 89/94/7974 NEGATIVE  NEGATIVE Final   Leukocytes,Ua 03/05/2024 MODERATE (A)  NEGATIVE Final   RBC / HPF 03/05/2024 0-5  0 - 5 RBC/hpf Final   WBC, UA 03/05/2024 21-50  0 - 5 WBC/hpf Final   Comment:        Reflex urine culture not performed if WBC <=10, OR if Squamous epithelial cells >5. If Squamous epithelial cells >5 suggest recollection.    Bacteria, UA 03/05/2024 NONE SEEN  NONE SEEN Final   Squamous Epithelial / HPF 03/05/2024 0-5  0 - 5 /HPF Final   Mucus 03/05/2024 PRESENT   Final   Performed at [facility], [address], [place], [place] [address]   Lipase 03/05/2024 152 (H)  11 - 51 U/L Final  Performed at [facility], [address], [place], [place] [address]   Sodium 03/05/2024 134 (L)  135 - 145 mmol/L Final   Potassium 03/05/2024 3.6  3.5 - 5.1 mmol/L Final   Chloride 03/05/2024 97 (L)  98 - 111 mmol/L Final   BUN 03/05/2024 12  6 - 20 mg/dL Final   Creatinine, Ser 03/05/2024 1.20  0.61 - 1.24 mg/dL Final   Glucose, Bld 89/94/7974 88  70 - 99 mg/dL Final   Glucose reference range applies only to samples taken after fasting for at least 8 hours.   Calcium, Ion 03/05/2024 1.18  1.15 - 1.40 mmol/L Final   TCO2 03/05/2024 24  22 - 32 mmol/L Final   Hemoglobin 03/05/2024 16.0  13.0 - 17.0 g/dL Final   HCT 89/94/7974 47.0  39.0 - 52.0 % Final   HIV-1 P24 Antigen - HIV24 03/05/2024 NON REACTIVE  NON REACTIVE Final   Comment: (NOTE) Detection of p24 may be inhibited by biotin in the sample, causing false negative results in acute infection.    HIV 1/2 Antibodies 03/05/2024 NON REACTIVE  NON REACTIVE Final   Interpretation (HIV Ag Ab) 03/05/2024 A non reactive test result means that HIV 1 or HIV 2 antibodies and HIV 1 p24 antigen were not detected in the specimen.   Final   Performed at [facility], [address], [place], [place] [address]   RPR Ser Ql 03/05/2024 NON REACTIVE  NON REACTIVE Final   Performed at [facility], [address], [place], [place] [address]   Sodium 03/05/2024 134 (L)  135 - 145 mmol/L Final   Potassium 03/05/2024 3.6  3.5 - 5.1 mmol/L Final   Chloride 03/05/2024 96 (L)  98 - 111 mmol/L Final   BUN 03/05/2024 13  6 - 20 mg/dL Final   Creatinine, Ser 03/05/2024 1.20  0.61 - 1.24 mg/dL Final   Glucose, Bld 89/94/7974 82  70 - 99 mg/dL Final   Glucose reference range applies only to samples taken after fasting for at least 8 hours.   Calcium, Ion 03/05/2024 1.15  1.15 - 1.40 mmol/L Final   TCO2 03/05/2024 25  22 - 32 mmol/L Final   Hemoglobin 03/05/2024 15.3  13.0 - 17.0 g/dL Final   HCT 89/94/7974 45.0  39.0 - 52.0 % Final   Lactic Acid, Venous 03/05/2024 0.7  0.5 - 1.9 mmol/L  Final   Specimen Description 03/05/2024 URINE, RANDOM   Final   Special Requests 03/05/2024 NONE Reflexed from [id number]   Final   Culture 03/05/2024    Final                   Value:NO GROWTH Performed at [facility], [address] [place], [place] [address]    Report Status 03/05/2024 03/06/2024 FINAL   Final    Allergies: Patient has no known allergies.  Medications:  Facility Ordered Medications  Medication   acetaminophen  (TYLENOL ) tablet 650 mg   alum & mag hydroxide-simeth (MAALOX/MYLANTA) 200-200-20 MG/5ML suspension 30 mL   magnesium  hydroxide (MILK OF MAGNESIA) suspension 30 mL   haloperidol  (HALDOL ) tablet 5 mg   And   diphenhydrAMINE  (BENADRYL ) capsule 50 mg   haloperidol  lactate (HALDOL ) injection 5 mg   And   diphenhydrAMINE  (BENADRYL ) injection 50 mg   And   LORazepam  (ATIVAN ) injection 2 mg   haloperidol  lactate (HALDOL ) injection 10 mg   And   diphenhydrAMINE  (BENADRYL ) injection 50 mg  And   LORazepam  (ATIVAN ) injection 2 mg   hydrOXYzine  (ATARAX ) tablet 25 mg   traZODone  (DESYREL ) tablet 50 mg   [START ON 07/06/2024] nicotine  (NICODERM CQ  - dosed in mg/24 hours) patch 14 mg      Medical Decision Making  Given the combination of passive suicidal ideation, prior suicide attempt, poor sleep, limited coping capacity, psychosocial instability, and lack of outpatient supports, the patient is assessed to be at moderate to high risk for self-harm. He is unable to ensure his own safety at this time. Inpatient psychiatric admission is medically necessary for safety monitoring, possible  initiation of pharmacologic treatment, structured therapeutic intervention, and comprehensive discharge planning to address housing and psychosocial needs.    Recommendations  Based on my evaluation the patient does not appear to have an emergency medical condition. Patient recommended for inpatient psychiatric admission for further evaluation and stabilization.  Labs  Ordered:  -EKG   -CBC  -CMP  -Ethanol  -A1c  -TSH  -Vitamin D  -Urine Drug Screen (UDS)  Protocols / PRN Medications:  -Agitation protocol initiated  -Tylenol  650 mg PO q6h PRN for pain  -Maalox / Mylanta 30 mL PO q4h PRN for indigestion  -Hydroxyzine  25 mg PO TID PRN for anxiety  -Trazodone  50 mg PO PRN nightly for sleep  -Nicotine  patch 14 mg Transdermal for smoking cessation   --Will defer other medication management to inpatient team since patient has been accepted to [facility] pending labs.    Ardelle JONELLE Blush, NP 07/05/24  5:13 PM

## 2024-07-05 NOTE — [facility] Group Notes (Signed)
 [facility] Group Notes:  (Nursing/MHT/Case Management/Adjunct)  Date:  07/05/2024  Time:  9:40 PM  Type of Therapy:  Group Therapy  Participation Level:  Did Not Attend  Participation Quality:  Did not attend  Affect:  Did not attend   Cognitive:  Did not attend   Insight:  None  Engagement in Group:  did not attend   Modes of Intervention:  Did not attend   Summary of Progress/Problems: Patient did not attend group this evening.   Anthony Williams 07/05/2024, 9:40 PM

## 2024-07-05 NOTE — BH Assessment (Deleted)
 SABRA
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
## 2024-07-05 NOTE — Progress Notes (Signed)
 Pt is admitted to [facility] due to depression. Pt is alert and oriented X4 with sad affect. Pt is ambulatory and oriented to staff/unit. Pt was cooperative with labs and skin assessment. Pt denies pain and current SI/HI/AVH,plan or intent. Staff will monitor for pt's safety.

## 2024-07-05 NOTE — Discharge Instructions (Addendum)
 Pt will be transferred to [facility]

## 2024-07-05 NOTE — ED Notes (Signed)
 Anthony Williams transferred to [facility] per NP order. Discussed with the patient and all questions fully answered. An EMTALA and Med Necessity forms were printed and to be given to the receiving nurse. All belongings returned. Patient escorted out, and transferred via safe transport. [name]  07/05/2024 6:07 PM

## 2024-07-05 NOTE — ED Notes (Signed)
 Report called to Erie Noe, RN [facility].

## 2024-07-06 DIAGNOSIS — F411 Generalized anxiety disorder: Secondary | ICD-10-CM | POA: Insufficient documentation

## 2024-07-06 DIAGNOSIS — F4323 Adjustment disorder with mixed anxiety and depressed mood: Secondary | ICD-10-CM | POA: Insufficient documentation

## 2024-07-06 MED ORDER — NICOTINE 14 MG/24HR TD PT24
14.0000 mg | MEDICATED_PATCH | Freq: Every day | TRANSDERMAL | Status: DC
  Administered 2024-07-06: 14 mg via TRANSDERMAL
  Filled 2024-07-06: qty 1

## 2024-07-06 MED ORDER — NICOTINE POLACRILEX 2 MG MT GUM: 2.0000 mg | CHEWING_GUM | OROMUCOSAL | Status: DC | PRN

## 2024-07-06 NOTE — Discharge Instructions (Signed)

## 2024-07-06 NOTE — [facility] Group Notes (Signed)
 Patient did not attend Music Therapy Group.

## 2024-07-06 NOTE — Group Note (Signed)
 LCSW Group Therapy Note   Group Date: 07/06/2024 Start Time: 1100 End Time: 1200   Participation:  did not attend   Type of Therapy:  Group Therapy   Topic:  Money Matters: Ecologist, Confidence and Peace of Mind  Objective: To help participants understand the impact of financial stability on well-being through the lens of Maslow's Hierarchy of Needs and develop practical strategies for budgeting, saving, and debt repayment.  Goals: Increase awareness of spending habits and financial priorities, recognizing how money supports basic needs, security, and relationships. Develop simple budgeting and saving strategies to enhance stability and peace of mind.  Reduce financial stress by creating a realistic debt repayment plan, supporting long-term confidence and well-being.  Summary:  Participants explored how financial stability connects to basic needs, relationships, and self-esteem using Maslow's Hierarchy. They discussed budgeting, saving, and debt repayment strategies, identifying small, manageable changes. Through interactive discussion and self-reflection, they gained insight into their financial habits and created personal action steps for improvement.  Therapeutic Modalities Used: Elements of Cognitive Behavioral Therapy (CBT) - Addressing financial stress and thought patterns. Psychoeducation - Engineer, agricultural. Elements of Motivational Interviewing (MI) - Encouraging realistic, achievable changes. Group Support - Reducing shame and stress through shared experiences.   Kani Chauvin O Yuliya Nova, LCSW 07/06/2024  12:53 PM
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
## 2024-07-06 NOTE — BHH Group Notes (Signed)
 Patient did not attend Social Work Group.

## 2024-07-06 NOTE — Plan of Care (Signed)
" °  Problem: Education: Goal: Mental status will improve Outcome: Progressing   Problem: Coping: Goal: Ability to verbalize frustrations and anger appropriately will improve Outcome: Progressing Goal: Ability to demonstrate self-control will improve Outcome: Progressing   Problem: Health Behavior/Discharge Planning: Goal: Identification of resources available to assist in meeting health care needs will improve Outcome: Progressing Goal: Compliance with treatment plan for underlying cause of condition will improve Outcome: Progressing   "

## 2024-07-06 NOTE — Progress Notes (Signed)
(  Sleep Hours) - 6.25 (Any PRNs that were needed, meds refused, or side effects to meds)- Trazodone , Hydroxyzine : effective (Any disturbances and when (visitation, over night)-None reported or observed (Concerns raised by the patient)- None reported or observed (SI/HI/AVH)- Denies   07/05/24 2015  Psych Admission Type (Psych Patients Only)  Admission Status Voluntary  Psychosocial Assessment  Patient Complaints Depression;Sadness  Eye Contact Fair  Facial Expression Sad  Affect Sad;Anxious  Speech Logical/coherent;Soft  Interaction Assertive  Motor Activity Slow  Appearance/Hygiene Unremarkable  Behavior Characteristics Appropriate to situation  Mood Sad;Pleasant  Aggressive Behavior  Effect No apparent injury  Thought Process  Coherency WDL  Content WDL  Delusions None reported or observed  Perception WDL  Hallucination None reported or observed  Judgment WDL  Confusion None  Danger to Self  Current suicidal ideation? Denies  Danger to Others  Danger to Others None reported or observed

## 2024-07-06 NOTE — Plan of Care (Signed)
   Problem: Education: Goal: Emotional status will improve Outcome: Progressing Goal: Mental status will improve Outcome: Progressing

## 2024-07-06 NOTE — Progress Notes (Addendum)
" °  [facility] Adult Case Management Discharge Plan :  Will you be returning to the same living situation after discharge:  Yes,  Pt returning home with roommates. At discharge, do you have transportation home?: Yes,  Pt will be picked up by his mother, Ms. Whittaker. Do you have the ability to pay for your medications: Yes,  Pt has medical insurance.  Release of information consent forms completed and in the chart;  Patient's signature needed at discharge.  Patient to Follow up at:  Follow-up Information     [facility] Follow up on 07/14/2024.   Why: You have a hospital follow up appointment for therapy and medication management services on 07/14/24 at 2:00 pm.  The appointment will be Virtual, telehealth. Contact information: [address]  [address] [place] [address] [phone]         [facility] Follow up.   Specialty: Behavioral Health Why: You may also call this provider to schedule appointments for in person therapy and/or medication management services. Contact information: [address] [place] [address] [phone]                 Next level of care provider has access to [facility] Link:no  Safety Planning and Suicide Prevention discussed: Yes,  Completed with mom, Ms. Whittaker.     Has patient been referred to the Quitline?: Patient refused referral for treatment  Patient has been referred for addiction treatment: Patient refused referral for treatment.  Derick JONELLE Blanch, LCSW 07/06/2024, 2:45 PM "

## 2024-07-06 NOTE — BHH Group Notes (Signed)
 Patient attended Nutrition Group.

## 2024-07-06 NOTE — Tx Team (Signed)
 Initial Treatment Plan 07/06/2024 3:26 AM Anthony Williams FMW:[id number]    PATIENT STRESSORS: Educational concerns   Financial difficulties   Other: Barely placed on the schedule; car has been in the shop for the past year and can't afford to get it out.     PATIENT STRENGTHS: Ability for insight  Average or above average intelligence  Motivation for treatment/growth  Supportive family/friends    PATIENT IDENTIFIED PROBLEMS: I need a therapist; someone to talk to on a regular basis.  I was hoping they could give me some medicine.  I am overwhelmed with not getting hours at work  I need my car fixed, but I can't afford it  I was overwhelmed when I spoke with my mom, but I just wanted to get help. A therapist to see outside, not inside.             DISCHARGE CRITERIA:  Adequate post-discharge living arrangements Improved stabilization in mood, thinking, and/or behavior Motivation to continue treatment in a less acute level of care Safe-care adequate arrangements made  PRELIMINARY DISCHARGE PLAN: Return to previous living arrangement Return to previous work or school arrangements  PATIENT/FAMILY INVOLVEMENT: This treatment plan has been presented to and reviewed with the patient, [name].  The patient have been given the opportunity to ask questions and make suggestions.  Kelly-Savage, Chrishonda Hesch Terrell, RN 07/06/2024, 3:26 AM

## 2024-07-06 NOTE — Group Note (Deleted)
 Date:  07/06/2024 Time:  2:37 PM  Group Topic/Focus: Drumming Group      Participation Level:  {BHH PARTICIPATION OZCZO:77735}  Participation Quality:  {BHH PARTICIPATION QUALITY:22265}  Affect:  {BHH AFFECT:22266}  Cognitive:  {BHH COGNITIVE:22267}  Insight: {BHH Insight2:20797}  Engagement in Group:  {BHH ENGAGEMENT IN HMNLE:77731}  Modes of Intervention:  {BHH MODES OF INTERVENTION:22269}  Additional Comments:  PIERRETTE Seashore D Emeli Goguen 07/06/2024, 2:37 PM

## 2024-07-06 NOTE — Group Note (Signed)
 Date:  07/06/2024 Time:  9:42 AM  Group Topic/Focus: Goals Group Goals Group:   The focus of this group is to help patients establish daily goals to achieve during treatment and discuss how the patient can incorporate goal setting into their daily lives to aide in recovery.    Participation Level:  Did Not Attend  Participation Quality:  NA  Affect:  NA  Cognitive:  NA  Insight: NA  Engagement in Group:  NA  Modes of Intervention:  NA  Additional Comments: Patient did not attend group.  Rosaleen BIRCH Bekim Werntz 07/06/2024, 9:42 AM
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
## 2024-07-06 NOTE — Group Note (Signed)
 Recreation Therapy Group Note   Group Topic:Other  Group Date: 07/06/2024 Start Time: 1303 End Time: 1347 Facilitators: Yunior Jain-McCall, LRT,CTRS Location: [address] Dayroom   Activity Description/Intervention: Therapeutic Drumming. Patients with peers and staff were given the opportunity to engage in a leader facilitated [facility] Group Empowerment Drumming Circle with staff from the [employer], in partnership with [employer]. Teaching laboratory technician and trained [place]alt [place], Norleen Mon leading with LRT observing and documenting intervention and pt response. This evidenced-based practice targets 7 areas of health and wellbeing in the human experience including: stress-reduction, exercise, self-expression, camaraderie/support, nurturing, spirituality, and music-making (leisure).   Goal Area(s) Addresses:  Patient will engage in pro-social way in music group.  Patient will follow directions of drum leader on the first prompt. Patient will demonstrate no behavioral issues during group.  Patient will identify if a reduction in stress level occurs as a result of participation in therapeutic drum circle.    Education: Leisure exposure, Pharmacologist, Musical expression, Discharge Planning   Affect/Mood: N/A   Participation Level: Did not attend    Clinical Observations/Individualized Feedback:      Plan: Continue to engage patient in RT group sessions 2-3x/week.   Tamel Abel-McCall, LRT,CTRS 07/06/2024 2:46 PM

## 2024-07-06 NOTE — Progress Notes (Signed)
 Tour of Duty:  Prentice JINNY Angle, RN, 07/06/24, Tour of Duty: 0700-1900  SI/HI/AVH: Denies  Self-Reported   Mood: Neutral  Anxiety: Denies, but Observable Depression: Denies, but Observable Irritability: Denies  Broset  Violence Prevention Guidelines *See Row Information*: (not recorded)   LBM  Last BM Date : 07/05/24   Pain: not present  Patient Refusals (including Rx): No  >>Shift Summary: Patient observed to be isolative to room . Patient able to make needs known. Patient presents as mildly anxious and depressed. Patient observed to engage appropriately with staff and peers.This shift, no PRN medication requested or required. No reported or observed side effects to medication. No reported or observed agitation, aggression, or other acute emotional distress. No reported or observed physical abnormalities or concerns. Per LP, patient likely to discharge tomorrow.  Patient discharged off unit at 1820. Patient belongings reviewed and acknowledged by patient. AVS and Transition Record reviewed and acknowledged by patient. Safety plan completed by patient, reviewed by nurse with patient and copy provided. Any medications and or prescriptions necessary for discharge addressed and provided to patient. Patient denies SI, plan or intent. Denies HI. Denies AVH. No observed or reported side effects to medication. No observed or reported agitation, aggression, or other acute emotional distress. No reported or observed physical abnormalities or concerns. Patient transportation from facility verified and observed.  Last Vitals  Vitals Weight: 53.8 kg Temp: (!) 97.5 F (36.4 C) Temp Source: Oral Pulse Rate: 61 Resp: 16 BP: 107/65 Patient Position: (not recorded)  Admission Type  Psych Admission Type (Psych Patients Only) Admission Status: Voluntary Date 72 hour document signed : (not recorded) Time 72 hour document signed : (not recorded) Provider Notified (First and Last Name) (see  details for LINK to note): (not recorded)   Psychosocial Assessment  Psychosocial Assessment Patient Complaints: None Eye Contact: Fair Facial Expression: Other (Comment) (WDL) Affect: Anxious Speech: Logical/coherent Interaction: Assertive Motor Activity: Other (Comment) (WDL) Appearance/Hygiene: Unremarkable Behavior Characteristics: Cooperative Mood: Anxious, Pleasant   Aggressive Behavior  Targets: (not recorded)   Thought Process  Thought Process Coherency: Within Defined Limits Content: Within Defined Limits Delusions: None reported or observed Perception: Within Defined Limits Hallucination: None reported or observed Judgment: Limited Confusion: None  Danger to Self/Others  Danger to Self Current suicidal ideation?: Denies Description of Suicide Plan: (not recorded) Self-Injurious Behavior: (not recorded) Agreement Not to Harm Self: (not recorded) Description of Agreement: (not recorded) Danger to Others: None reported or observed

## 2024-07-06 NOTE — BHH Suicide Risk Assessment (Signed)
 BHH INPATIENT:  Family/Significant Other Suicide Prevention Education  Suicide Prevention Education:  Education Completed; Scientist, Research (medical) (mom) (and completed with patient),  (name of family member/significant other) has been identified by the patient as the family member/significant other with whom the patient will be residing, and identified as the person(s) who will aid the patient in the event of a mental health crisis (suicidal ideations/suicide attempt).  With written consent from the patient, the family member/significant other has been provided the following suicide prevention education, prior to the and/or following the discharge of the patient.  The suicide prevention education provided includes the following: Suicide risk factors Suicide prevention and interventions National Suicide Hotline telephone number [facility] assessment telephone number [facility] Emergency Assistance 911 [facility] and/or Residential Mobile Crisis Unit telephone number  Request made of family/significant other to: Remove weapons (e.g., guns, rifles, knives), all items previously/currently identified as safety concern.   Remove drugs/medications (over-the-counter, prescriptions, illicit drugs), all items previously/currently identified as a safety concern.  The family member/significant other verbalizes understanding of the suicide prevention education information provided.  The family member/significant other agrees to remove the items of safety concern listed above.  Derick JONELLE Blanch, LCSW 07/06/2024, 2:44 PM

## 2024-07-06 NOTE — BHH Group Notes (Signed)
 Patient did not attend Occupational Therapy Group.
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
